# Patient Record
Sex: Male | Born: 2000 | Race: White | Hispanic: No | Marital: Single | Smoking: Never smoker
Health system: Southern US, Community
[De-identification: ages and names within clinical notes are randomized; demographics above are authoritative.]

## PROBLEM LIST (undated history)

## (undated) DIAGNOSIS — Z9109 Other allergy status, other than to drugs and biological substances: Secondary | ICD-10-CM

---
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
## 2023-10-25 ENCOUNTER — Other Ambulatory Visit: Payer: Self-pay

## 2023-10-25 ENCOUNTER — Encounter (HOSPITAL_BASED_OUTPATIENT_CLINIC_OR_DEPARTMENT_OTHER): Payer: Self-pay

## 2023-10-25 ENCOUNTER — Emergency Department (HOSPITAL_BASED_OUTPATIENT_CLINIC_OR_DEPARTMENT_OTHER)
Admission: EM | Admit: 2023-10-25 | Discharge: 2023-10-25 | Disposition: A | Discharge status: Home / Self Care | Payer: Worker's Compensation | Attending: Emergency Medicine | Admitting: Emergency Medicine

## 2023-10-25 DIAGNOSIS — Y990 Civilian activity done for income or pay: Secondary | ICD-10-CM | POA: Diagnosis not present

## 2023-10-25 DIAGNOSIS — S0990XA Unspecified injury of head, initial encounter: Secondary | ICD-10-CM | POA: Insufficient documentation

## 2023-10-25 DIAGNOSIS — W228XXA Striking against or struck by other objects, initial encounter: Secondary | ICD-10-CM | POA: Diagnosis not present

## 2023-10-25 MED ORDER — ACETAMINOPHEN 325 MG PO TABS
975.0000 mg | ORAL_TABLET | Freq: Once | ORAL | Status: AC
  Administered 2023-10-25: 975 mg via ORAL
  Filled 2023-10-25: qty 3

## 2023-10-25 NOTE — ED Provider Notes (Signed)
[facility] Provider Note   CSN: [id number] Arrival date & time: 10/25/23  4098     History  Chief Complaint  Patient presents with   Head Injury    Dustin Romero is a 22 y.o. male.  Who is otherwise feeding presents to the ED for head injury.  Patient works at [employer] where he suffered a head injury while working.  He struck the top of his head on a handle at work that he did not see above him.  No LOC.  He feels a little "foggy" now but has remained at his neurologic baseline without significant confusion.  He remembers the entire event.  No vomiting, neck pain or other injuries.  No prior history of bleeding disorders or anticoagulation   Head Injury      Home Medications Prior to Admission medications   Medication Sig Start Date End Date Taking? Authorizing Provider  cetirizine (ZYRTEC) 10 MG chewable tablet Chew 10 mg by mouth daily.    [provider]  Melatonin 5 MG CAPS Take by mouth.    [provider]      Allergies    Patient has no known allergies.    Review of Systems   Review of Systems  Physical Exam Updated Vital Signs BP 128/89 (BP Location: Right Arm)   Pulse 72   Temp 97.8 F (36.6 C) (Oral)   Resp 16   SpO2 100%  Physical Exam Vitals and nursing note reviewed.  HENT:     Head: Normocephalic and atraumatic.  Eyes:     Pupils: Pupils are equal, round, and reactive to light.  Cardiovascular:     Rate and Rhythm: Normal rate and regular rhythm.  Pulmonary:     Effort: Pulmonary effort is normal.     Breath sounds: Normal breath sounds.  Abdominal:     Palpations: Abdomen is soft.     Tenderness: There is no abdominal tenderness.  Musculoskeletal:     Cervical back: Neck supple. No tenderness.  Skin:    General: Skin is warm and dry.  Neurological:     General: No focal deficit present.     Mental Status: He is alert and oriented to person, place, and time. Mental  status is at baseline.     Cranial Nerves: No cranial nerve deficit.     Sensory: No sensory deficit.     Motor: No weakness.     Coordination: Coordination normal.  Psychiatric:        Mood and Affect: Mood normal.     ED Results / Procedures / Treatments   Labs (all labs ordered are listed, but only abnormal results are displayed) Labs Reviewed - No data to display  EKG None  Radiology No results found.  Procedures Procedures    Medications Ordered in ED Medications  acetaminophen (TYLENOL) tablet 975 mg (has no administration in time range)    ED Course/ Medical Decision Making/ A&P                                 Medical Decision Making Healthy 22 year old male presenting after head injury without loss of consciousness.  Struck top of head on a metal window handle.  No vomiting, depressed mental status, amnesia or other red flag symptoms that would merit CT imaging.  [place] CT head injury rule negative (detailed below).  Will provide Tylenol for analgesia and  discharge.  Return precautions were discussed with the patient mother in detail.  Will provide the patient with a work note.  [place] CT Head Injury/Trauma Rule from [email or web address]  on 10/25/2023 ** All calculations should be rechecked by clinician prior to use **  RESULT SUMMARY: CT Unnecessary   The [place] CT Head Rule suggests a head CT is not necessary for this patient (sensitivity 83-100% for all intracranial traumatic findings, sensitivity 100% for findings requiring neurosurgical intervention).   INPUTS: Age <16 years -> 0 = No Patient on blood thinners -> 0 = No Seizure after injury -> 0 =  No GCS <15 at 2 hours post-injury -> 0 = No Suspected open or depressed skull fracture -> 0 = No Any sign of basilar skull fracture? -> 0 = No >=2 episodes of vomiting -> 0 = No Age >=65 years -> 0 = No Retrograde amnesia to the event >= 30 minutes -> 0 = No "Dangerous" mechanism? -> 0 = No   Risk OTC  drugs.           Final Clinical Impression(s) / ED Diagnoses Final diagnoses:  Closed head injury, initial encounter    Rx / DC Orders ED Discharge Orders     None         Royanne Foots, DO 10/25/23 9518

## 2023-10-25 NOTE — Discharge Instructions (Signed)
You were seen in the emerged part after head injury Fortunately you appear well and do not require CAT scan of your head at this time As discussed, you may feel foggy over the next days to week due to a concussion Rest for the rest of the day and try to limit screen time.  Resume normal activity tomorrow as long as you are feeling well Return to the emergency department for severe headaches, excessive sleepiness or any other concerns Otherwise please follow-up with your primary doctor in 1 week for reevaluation

## 2023-10-25 NOTE — ED Triage Notes (Signed)
He states that he struck the top of his head on a metal object while at work this morning. He is alert and oriented x 4 with clear speech. He denies any l.o.c., but endorses having some "difficulty forming sentences for a couple of minutes". He is ambulatory and in no distress. His mother is with him.
